# Patient Record
Sex: Male | Born: 1940 | Race: White | Hispanic: No | Marital: Married | Smoking: Former smoker
Health system: Southern US, Community
[De-identification: ages and names within clinical notes are randomized; demographics above are authoritative.]

## PROBLEM LIST (undated history)

## (undated) DIAGNOSIS — M1990 Unspecified osteoarthritis, unspecified site: Secondary | ICD-10-CM

## (undated) DIAGNOSIS — C801 Malignant (primary) neoplasm, unspecified: Secondary | ICD-10-CM

## (undated) DIAGNOSIS — E7800 Pure hypercholesterolemia, unspecified: Secondary | ICD-10-CM

## (undated) DIAGNOSIS — I10 Essential (primary) hypertension: Secondary | ICD-10-CM

## (undated) DIAGNOSIS — E119 Type 2 diabetes mellitus without complications: Secondary | ICD-10-CM

## (undated) DIAGNOSIS — I509 Heart failure, unspecified: Secondary | ICD-10-CM

## (undated) HISTORY — PX: KNEE SURGERY: SHX244

## (undated) HISTORY — PX: BLADDER SURGERY: SHX569

## (undated) HISTORY — PX: BICEPS TENDON REPAIR: SHX566

## (undated) HISTORY — PX: COLONOSCOPY: SHX174

## (undated) HISTORY — PX: TONSILLECTOMY: SUR1361

## (undated) HISTORY — PX: BACK SURGERY: SHX140

---

## 1998-06-17 ENCOUNTER — Emergency Department (HOSPITAL_COMMUNITY): Admission: EM | Admit: 1998-06-17 | Discharge: 1998-06-17 | Payer: Self-pay | Admitting: Emergency Medicine

## 1999-10-24 ENCOUNTER — Ambulatory Visit (HOSPITAL_COMMUNITY): Admission: RE | Admit: 1999-10-24 | Discharge: 1999-10-24 | Payer: Self-pay | Admitting: Gastroenterology

## 2000-03-27 ENCOUNTER — Ambulatory Visit (HOSPITAL_COMMUNITY): Admission: RE | Admit: 2000-03-27 | Discharge: 2000-03-27 | Payer: Self-pay | Admitting: Internal Medicine

## 2000-04-18 ENCOUNTER — Ambulatory Visit (HOSPITAL_COMMUNITY): Admission: RE | Admit: 2000-04-18 | Discharge: 2000-04-18 | Payer: Self-pay | Admitting: Internal Medicine

## 2000-05-27 ENCOUNTER — Ambulatory Visit (HOSPITAL_COMMUNITY): Admission: RE | Admit: 2000-05-27 | Discharge: 2000-05-27 | Payer: Self-pay | Admitting: Specialist

## 2000-05-27 ENCOUNTER — Encounter: Payer: Self-pay | Admitting: Specialist

## 2001-06-12 ENCOUNTER — Encounter: Payer: Self-pay | Admitting: Neurosurgery

## 2001-06-12 ENCOUNTER — Encounter: Admission: RE | Admit: 2001-06-12 | Discharge: 2001-06-12 | Payer: Self-pay | Admitting: Neurosurgery

## 2001-06-26 ENCOUNTER — Encounter: Admission: RE | Admit: 2001-06-26 | Discharge: 2001-06-26 | Payer: Self-pay | Admitting: Neurosurgery

## 2001-06-26 ENCOUNTER — Encounter: Payer: Self-pay | Admitting: Neurosurgery

## 2001-07-10 ENCOUNTER — Encounter: Admission: RE | Admit: 2001-07-10 | Discharge: 2001-07-10 | Payer: Self-pay | Admitting: Neurosurgery

## 2001-07-10 ENCOUNTER — Encounter: Payer: Self-pay | Admitting: Neurosurgery

## 2002-05-22 ENCOUNTER — Encounter: Admission: RE | Admit: 2002-05-22 | Discharge: 2002-05-22 | Payer: Self-pay | Admitting: Neurosurgery

## 2002-05-22 ENCOUNTER — Encounter: Payer: Self-pay | Admitting: Neurosurgery

## 2002-06-05 ENCOUNTER — Encounter: Payer: Self-pay | Admitting: Neurosurgery

## 2002-06-05 ENCOUNTER — Encounter: Admission: RE | Admit: 2002-06-05 | Discharge: 2002-06-05 | Payer: Self-pay | Admitting: Neurosurgery

## 2002-06-19 ENCOUNTER — Encounter: Admission: RE | Admit: 2002-06-19 | Discharge: 2002-06-19 | Payer: Self-pay | Admitting: Neurosurgery

## 2002-06-19 ENCOUNTER — Encounter: Payer: Self-pay | Admitting: Neurosurgery

## 2002-11-27 ENCOUNTER — Ambulatory Visit (HOSPITAL_COMMUNITY): Admission: RE | Admit: 2002-11-27 | Discharge: 2002-11-27 | Payer: Self-pay | Admitting: Gastroenterology

## 2005-10-24 ENCOUNTER — Encounter: Admission: RE | Admit: 2005-10-24 | Discharge: 2005-10-24 | Payer: Self-pay | Admitting: Family Medicine

## 2006-09-11 ENCOUNTER — Emergency Department (HOSPITAL_COMMUNITY): Admission: EM | Admit: 2006-09-11 | Discharge: 2006-09-11 | Payer: Self-pay | Admitting: Emergency Medicine

## 2006-10-04 ENCOUNTER — Inpatient Hospital Stay (HOSPITAL_COMMUNITY): Admission: RE | Admit: 2006-10-04 | Discharge: 2006-10-05 | Payer: Self-pay | Admitting: Neurosurgery

## 2007-01-09 ENCOUNTER — Ambulatory Visit (HOSPITAL_COMMUNITY): Admission: RE | Admit: 2007-01-09 | Discharge: 2007-01-09 | Payer: Self-pay | Admitting: Neurosurgery

## 2007-05-14 ENCOUNTER — Ambulatory Visit: Admission: RE | Admit: 2007-05-14 | Discharge: 2007-05-14 | Payer: Self-pay | Admitting: Specialist

## 2007-05-14 ENCOUNTER — Ambulatory Visit: Payer: Self-pay | Admitting: Vascular Surgery

## 2010-04-19 ENCOUNTER — Encounter: Admission: RE | Admit: 2010-04-19 | Discharge: 2010-04-19 | Payer: Self-pay | Admitting: Neurosurgery

## 2010-07-03 ENCOUNTER — Encounter: Admission: RE | Admit: 2010-07-03 | Discharge: 2010-07-03 | Payer: Self-pay | Admitting: Family Medicine

## 2010-10-29 ENCOUNTER — Encounter: Payer: Self-pay | Admitting: Family Medicine

## 2011-02-23 NOTE — Op Note (Signed)
Douglas Haas, Douglas Haas                ACCOUNT NO.:  [id number]   MEDICAL RECORD NO.:  [id number]          PATIENT TYPE:  INP   LOCATION:  3013                         FACILITY:  [facility]   PHYSICIAN:  Payton Doughty, M.D.      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   POSTOPERATIVE DIAGNOSIS:  Spondylosis on the left side L3-4, L4-5, and  L5-S1.   OPERATIVE PROCEDURES:  L3-4, L4-5, L5-S1 laminotomy, foraminotomy down  on the left.   SURGEON:  Payton Doughty, M.D.   BODY OF TEXT:  A 70 year old gentleman with severe left lower extremity  pain and spondylosis on the left side at L3-4, L4-5 and L5-S1 taken to  operating room and smoothly anesthetized and intubated, placed prone on  the operating table; following shave, prepped and draped in the usual  sterile fashion.  Skin was infiltrated with 1% lidocaine with 1:400,000  epinephrine.  Skin was incised from the top of L3 to the bottom of L5;  and the lamina of L3, L4, and L5 were exposed on the left side in the  subperiosteal plane.  Intraoperative x-ray confirmed the correctness of  this level.   Laminotomy and foraminotomy was carried out on the left side using the  high-speed drill and the Kerrison.  The bone was removed to the top of  the ligamentum flavum; that was removed in a retrograde fashion, and  undercut laterally.  This allowed decompression of the medial part of  the neural foramina as well as the proximal portion of the nerve root.  The most severely affected level was L4-5 where there was significant  compression on both the L4 and L5 root as they traversed this area  following complete decompression, each neural foramen was carefully  inspected and found to be open.   The wound was irrigated; and hemostasis assured.  The laminotomy defects  were filled with  Depo-Medrol soaked fat.  Successive layers of #0  Vicryl, 2-0 Vicryl, and 3-0 nylon were used to close.  Betadine and  Telfa dressing was applied and OpSite.  The patient returned to the  recovery room in good condition.   .           ______________________________  Payton Doughty, M.D.     MWR/MEDQ  D:  10/04/2006  T:  10/04/2006  Job:  [id number]

## 2011-02-23 NOTE — Op Note (Signed)
   NAMECAROLD, EISNER                          ACCOUNT NO.:  [id number]   MEDICAL RECORD NO.:  [id number]                   PATIENT TYPE:  AMB   LOCATION:  ENDO                                 FACILITY:  [facility]   PHYSICIAN:  James L. Malon Kindle., M.D.          DATE OF BIRTH:  Oct 05, 1941   DATE OF PROCEDURE:  11/27/2002  DATE OF DISCHARGE:                                 OPERATIVE REPORT   PROCEDURE:  Colonoscopy.   MEDICATIONS:  Fentanyl 60 mcg, and Versed 6 mg IV.   INSTRUMENT USED:  Olympus pediatric colonoscope.   INDICATIONS FOR PROCEDURE:  The patient with a previous history of  adenomatous polyps.  This is done as a three-year follow-up.   DESCRIPTION OF PROCEDURE:  The procedure had been explained to the patient  and consent obtained. With the patient in the left lateral decubitus  position, the Olympus scope was inserted and advanced under direct  visualization.  The prep was excellent.  We were able to advance to the  cecum without difficulty.  The ileocecal valve and appendiceal orifice seen.  The scope was withdrawn and the cecum, ascending colon, hepatic flexure,  transverse colon, splenic flexure, and descending colon were seen well with  no further polyps seen.  Small lipoma seen in the ascending colon. No polyps  throughout. The rectum was free of polyps as well. The scope was withdrawn  and the patient tolerated the procedure well.   ASSESSMENT:  No evidence of further colon polyps.   PLAN:  Will recommend repeating in five years and recommend yearly  hemoccults.                                               James L. Malon Kindle., M.D.    Waldron Session  D:  11/27/2002  T:  11/27/2002  Job:  [id number]   cc:   L. Lupe Carney, M.D.  [address] [place]  [place] [address]  Fax: [phone]

## 2011-02-23 NOTE — Procedures (Signed)
[place]  Patient:    Douglas Haas                          MRN: [id number] Proc. Date: 10/24/99 Adm. Date:  [id number] Attending:  Orland Mustard CC:         Abran Cantor. Clovis Riley, MD                           Procedure Report  PROCEDURE PERFORMED:  Colonoscopy with polypectomy.  ENDOSCOPIST:  Llana Aliment. Randa Evens, M.D.  MEDICATIONS USED:  Fentanyl 50 mcg, Versed 5 mg IV.  INSTRUMENT:  Olympus adult video colonoscope.  INDICATIONS:  The patient is a 70 year old gentleman with a personal history of  bladder cancer treated laparoscopically and extraordinarily positive family history of colon cancer.  He had an aunt and brother die of colon cancer and another brother had colon cancer but was cured with surgery.  Due to his bladder cancer, strongly positive family history of colon cancer, colonoscopy is requested.  DESCRIPTION OF PROCEDURE:  The procedure had been explained to the patient and consent obtained.  With the patient in the left lateral decubitus position, the  Olympus adult video colonoscope was inserted and advanced under direct visualization.  The prep was excellent and we were able to reach the cecum without difficulty. The ileocecal valve and appendiceal orifice were seen.  The scope was withdrawn.  The cecum, ascending colon, hepatic flexure, transverse colon were een well and were normal.  In the proximal descending colon, a 1 cm pedunculated polyp was encountered and was removed with a snare and sucked through the scope. There was no bleeding at the site.  The remainder of the colon was carefully examined  including the remainder of the descending colon and sigmoid colon and no other polyps or other lesions were seen.  Internal hemorrhoids were seen in the rectum upon removal of the scope.  Scope withdrawn, patient tolerated the procedure well. Maintained on low flow oxygen and pulse oximeter throughout the procedure  with o obvious problem.  ASSESSMENT:  Descending colon polyp removed.  PLAN:  Will check pathology report and plan on repeating his colonoscopy in three years. DD:  10/24/99 TD:  10/24/99 Job: [id number]

## 2011-02-23 NOTE — H&P (Signed)
NAMEADRIANA, LINA NO.:  [id number]   MEDICAL RECORD NO.:  [id number]          PATIENT TYPE:  INP   LOCATION:  3013                         FACILITY:  [facility]   PHYSICIAN:  Payton Doughty, M.D.      DATE OF BIRTH:  [birth year]   DATE OF ADMISSION:  10/04/2006  DATE OF DISCHARGE:                              HISTORY & PHYSICAL   ADMITTING DIAGNOSIS:  Spondylosis of L3-4, L4-5, L5-S1.   HISTORY OF PRESENT ILLNESS:  This is a 70 year old right-handed white  gentleman who has off and on had pain in his back and severe attacks  down his left leg over the past couple of years.  I had been following  him on a 74-month basis, but he has recently had several bouts of pain  down his back, down his left leg.  MR demonstrates severe spondylosis at  L3-4, L4-5, L5-S1, particularly off to the left side.  He is admitted  now for decompressive laminotomy, foraminotomy at those levels.   MEDICAL HISTORY:  Is otherwise benign.   He takes Vytorin, Surveyor, quantity.   He is ALLERGIC to NAPROSYN, PENICILLIN, and DILAUDID.   SURGICAL HISTORY:  He has a knee operation in March of '02.   FAMILY HISTORY:  Mom is 15 and in good health with early diabetes.  Dad  is deceased at 9 with Parkinson's disease.   SOCIAL HISTORY:  Does not smoke, quit 35 years ago.  Does not drink  alcohol and is a Merchandiser, retail at a dock.   REVIEW OF SYSTEMS:  Remarkable for back pain, leg pain, tenderness,  sinus tenderness, wearing glasses.   PHYSICAL EXAMINATION:  HEENT EXAM:  Within normal limits.  He has  reasonable range of motion in the neck.  CHEST:  Clear.  CARDIAC EXAM:  Regular rate and rhythm.  ABDOMEN:  Nontender.  No hepatosplenomegaly.  EXTREMITIES:  Without clubbing or cyanosis.  GU EXAM:  Deferred.  EXTREMITIES:  Peripheral pulses are good.  NEUROLOGICALLY:  He is awake, alert, and oriented.  His cranial nerves  are intact.  Motor exam shows 5/5 strength throughout the upper and  lower  extremities except for the dorsiflexors of the left that are 5-/5.  He has a positive straight leg raise on the left.  The sensory  dysesthesia described in a left L4 and L5 distribution.  Reflexes are  absent at the left knee, one at right, one at the ankles bilaterally.   MR demonstrates severe spondylosis at 3-4, 4-5, and 5-1, worse on the  left.   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   PLAN:  For lumbar laminotomy, foraminotomy at 3-4, 4-5, and 5-1.  The  risks and benefits of this approach have been discussed with him and he  wishes to proceed.           ______________________________  Payton Doughty, M.D.     MWR/MEDQ  D:  10/04/2006  T:  10/04/2006  Job:  [id number]

## 2013-08-04 ENCOUNTER — Other Ambulatory Visit: Payer: Self-pay | Admitting: Gastroenterology

## 2013-08-05 ENCOUNTER — Encounter (HOSPITAL_COMMUNITY): Payer: Self-pay | Admitting: *Deleted

## 2013-08-05 ENCOUNTER — Ambulatory Visit (HOSPITAL_COMMUNITY)
Admission: RE | Admit: 2013-08-05 | Discharge: 2013-08-05 | Disposition: A | Discharge status: Home / Self Care | Payer: Medicare Other | Source: Ambulatory Visit | Attending: Gastroenterology | Admitting: Gastroenterology

## 2013-08-05 ENCOUNTER — Encounter (HOSPITAL_COMMUNITY)
Admission: RE | Disposition: A | Discharge status: Home / Self Care | Payer: Self-pay | Source: Ambulatory Visit | Attending: Gastroenterology

## 2013-08-05 DIAGNOSIS — K6289 Other specified diseases of anus and rectum: Secondary | ICD-10-CM | POA: Insufficient documentation

## 2013-08-05 HISTORY — DX: Malignant (primary) neoplasm, unspecified: C80.1

## 2013-08-05 HISTORY — DX: Unspecified osteoarthritis, unspecified site: M19.90

## 2013-08-05 HISTORY — DX: Essential (primary) hypertension: I10

## 2013-08-05 HISTORY — DX: Type 2 diabetes mellitus without complications: E11.9

## 2013-08-05 HISTORY — DX: Pure hypercholesterolemia, unspecified: E78.00

## 2013-08-05 HISTORY — DX: Heart failure, unspecified: I50.9

## 2013-08-05 HISTORY — PX: EUS: SHX5427

## 2013-08-05 LAB — GLUCOSE, CAPILLARY: Glucose-Capillary: 115 mg/dL — ABNORMAL HIGH (ref 70–99)

## 2013-08-05 SURGERY — ULTRASOUND, LOWER GI TRACT, ENDOSCOPIC
Anesthesia: Moderate Sedation

## 2013-08-05 MED ORDER — MIDAZOLAM HCL 10 MG/2ML IJ SOLN
INTRAMUSCULAR | Status: AC
  Filled 2013-08-05: qty 2

## 2013-08-05 MED ORDER — SODIUM CHLORIDE 0.9 % IV SOLN
INTRAVENOUS | Status: DC
  Administered 2013-08-05: 500 mL via INTRAVENOUS

## 2013-08-05 MED ORDER — FENTANYL CITRATE 0.05 MG/ML IJ SOLN
INTRAMUSCULAR | Status: AC
  Filled 2013-08-05: qty 2

## 2013-08-05 MED ORDER — FENTANYL CITRATE 0.05 MG/ML IJ SOLN
INTRAMUSCULAR | Status: DC | PRN
  Administered 2013-08-05 (×2): 25 ug via INTRAVENOUS

## 2013-08-05 MED ORDER — CIPROFLOXACIN IN D5W 400 MG/200ML IV SOLN
INTRAVENOUS | Status: AC
  Filled 2013-08-05: qty 200

## 2013-08-05 MED ORDER — MIDAZOLAM HCL 10 MG/2ML IJ SOLN
INTRAMUSCULAR | Status: DC | PRN
  Administered 2013-08-05 (×2): 2 mg via INTRAVENOUS

## 2013-08-05 MED ORDER — SODIUM CHLORIDE 0.9 % IV SOLN: INTRAVENOUS | Status: DC

## 2013-08-05 NOTE — Addendum Note (Signed)
Addended by: Willis Modena on: 08/05/2013 07:57 AM   Modules accepted: Orders

## 2013-08-05 NOTE — Op Note (Signed)
[facility] [address] [place], [address]   OPERATIVE PROCEDURE REPORT  PATIENT: Douglas Haas, Douglas Haas  MR#: [id number] BIRTHDATE: [birth year]  GENDER: Male ENDOSCOPIST: Willis Modena, MD REFERRED BY:  Carman Ching, M.D. PROCEDURE DATE:  08/05/2013 PROCEDURE:   Flexible sigmoidoscopy EUS ASA CLASS:   Class II INDICATIONS:1.  rectal nodule. MEDICATIONS: Fentanyl 50 mcg IV and Versed 4 mg IV  DESCRIPTION OF PROCEDURE:   After the risks benefits and alternatives of the procedure were thoroughly explained, informed consent was obtained.  Throughout the procedure, the patients blood pressure, pulse and oxygen saturations were monitored continuously. Under direct visualization, the forward-viewing radial echoendoscope was introduced through the anus  and advanced to the rectum .  Water was used as necessary to provide an acoustic interface.  Imaging was obtained at 7.5 and . Upon completion of the imaging, water was removed and the patient was sent to the recovery room in satisfactory condition.    FINDINGS:  Digital rectal exam normal.  Echoendoscopic subsequently inserted into the rectum, and water was instilled to facilitate acoustic coupling.  The submucosal nodule was seen in the distal rectum, a few cm proximal to the anal verge.  The lesion is soft to gentle probing.  The lesion is submucosal, with normal overlying mucosa; there is no involvement of the muscularis propria.  The lesion measures about 14 mm x 6 mm in size, is very well-defined and has "salt and pepper" echotexture.  ENDOSCOPIC IMPRESSION: Submucosal distal rectal nodule.  Findings very consistent with lipoma.  No involvement of MP, thus no concern for GIST or leiomyoma.  No features suggestive of carcinoid tumor were identified.  RECOMMENDATIONS: 1.  Watch for potential complications of procedure. 2.  Would not pursue any further routine surveillance of this area; can reinspect in 5  years at time of his next surveillance colonoscopy. 3.  Follow-up with Dr. Randa Evens.   _______________________________ Rosalie DoctorWillis Modena, MD 08/05/2013 11:39 AM   CC:

## 2013-08-05 NOTE — H&P (Signed)
Patient interval history reviewed.  Patient examined again.  There has been no change from documented H/P dated 07/15/13 (scanned into chart from our office) except as documented above.  Assessment:  1.  Rectal nodule.  Plan:  1.  Endorectal ultrasound with possible biopsies (fine needle aspiration, FNA). 2.  Risks (bleeding, infection, bowel perforation that could require surgery, sedation-related changes in cardiopulmonary systems), benefits (identification and possible treatment of source of symptoms, exclusion of certain causes of symptoms), and alternatives (watchful waiting, radiographic imaging studies, empiric medical treatment) of endorectal ultrasound with possible biopsies (RUS +/- FNA) were explained to patient in detail and patient wishes to proceed.

## 2013-08-06 ENCOUNTER — Encounter (HOSPITAL_COMMUNITY): Payer: Self-pay | Admitting: Gastroenterology

## 2013-12-12 DIAGNOSIS — E7800 Pure hypercholesterolemia, unspecified: Secondary | ICD-10-CM

## 2013-12-12 DIAGNOSIS — N529 Male erectile dysfunction, unspecified: Secondary | ICD-10-CM

## 2013-12-12 DIAGNOSIS — I519 Heart disease, unspecified: Secondary | ICD-10-CM

## 2013-12-12 DIAGNOSIS — I509 Heart failure, unspecified: Principal | ICD-10-CM

## 2013-12-12 DIAGNOSIS — E669 Obesity, unspecified: Secondary | ICD-10-CM

## 2013-12-12 DIAGNOSIS — E119 Type 2 diabetes mellitus without complications: Secondary | ICD-10-CM

## 2013-12-12 DIAGNOSIS — Z8551 Personal history of malignant neoplasm of bladder: Secondary | ICD-10-CM

## 2013-12-12 DIAGNOSIS — I110 Hypertensive heart disease with heart failure: Secondary | ICD-10-CM

## 2014-08-11 ENCOUNTER — Other Ambulatory Visit (INDEPENDENT_AMBULATORY_CARE_PROVIDER_SITE_OTHER): Payer: Self-pay | Admitting: Otolaryngology

## 2014-08-11 DIAGNOSIS — H905 Unspecified sensorineural hearing loss: Secondary | ICD-10-CM

## 2014-08-11 DIAGNOSIS — H9041 Sensorineural hearing loss, unilateral, right ear, with unrestricted hearing on the contralateral side: Secondary | ICD-10-CM

## 2014-08-19 ENCOUNTER — Ambulatory Visit
Admission: RE | Admit: 2014-08-19 | Discharge: 2014-08-19 | Disposition: A | Discharge status: Home / Self Care | Payer: Commercial Managed Care - HMO | Source: Ambulatory Visit | Attending: Otolaryngology | Admitting: Otolaryngology

## 2014-08-19 DIAGNOSIS — H9041 Sensorineural hearing loss, unilateral, right ear, with unrestricted hearing on the contralateral side: Secondary | ICD-10-CM

## 2014-08-19 DIAGNOSIS — H905 Unspecified sensorineural hearing loss: Secondary | ICD-10-CM

## 2014-08-19 MED ORDER — GADOBENATE DIMEGLUMINE 529 MG/ML IV SOLN
20.0000 mL | Freq: Once | INTRAVENOUS | Status: AC | PRN
  Administered 2014-08-19: 20 mL via INTRAVENOUS

## 2016-08-31 IMAGING — MR MR HEAD WO/W CM
10 of 11 series · 31 of 48 positions shown · IV contrast (20ml multihance)
Comparison: CT 10/12/2009

CLINICAL DATA: Right-sided hearing loss and pain, 1 month duration.

BUN and creatinine were obtained on site at [HOSPITAL] at
[HOSPITAL].
Results:  BUN 11 mg/dL,  Creatinine 1.0 mg/dL.
EXAM:
MRI HEAD WITHOUT AND WITH CONTRAST
TECHNIQUE: Multiplanar, multiecho pulse sequences of the brain and surrounding
structures were obtained without and with intravenous contrast.
CONTRAST:  20mL MULTIHANCE GADOBENATE DIMEGLUMINE 529 MG/ML IV SOLN

[Series 2: T1 · sagittal · 5.0mm · 0.45mm/px · 2 of 21 slices shown (1 of 3)]
[im 1/21]
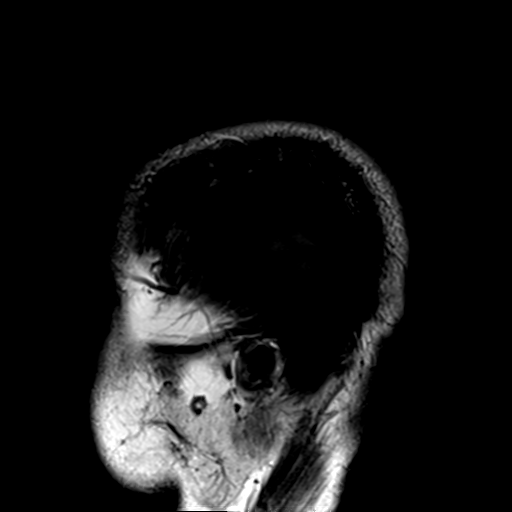
[im 21/21]
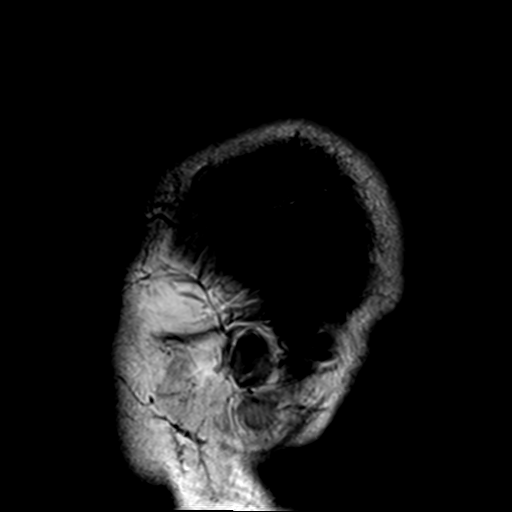

[Series 3: DWI · axial · 5.0mm · 1.80mm/px · z∈[-34,+114]mm · 8 of 48 slices shown (1 of 2)]
[im 1/48]
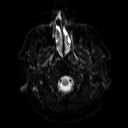
[im 7/48]
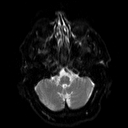
[im 14/48]
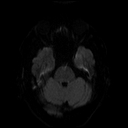
[im 21/48]
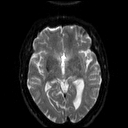
[im 27/48]
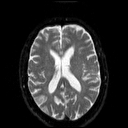
[im 34/48]
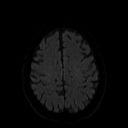
[im 41/48]
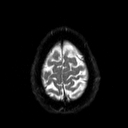
[im 48/48]
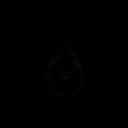

[Series 4: DWI · axial · 5.0mm · 1.80mm/px · z∈[-34,+114]mm · 4 of 24 slices shown (2 of 2)]
[im 1/24]
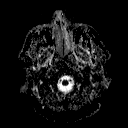
[im 8/24]
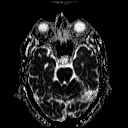
[im 16/24]
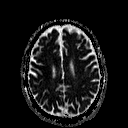
[im 24/24]
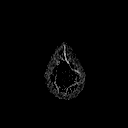

[Series 5: T2 · axial · 5.0mm · 0.45mm/px · z∈[-32,+116]mm · 4 of 24 slices shown]
[im 1/24]
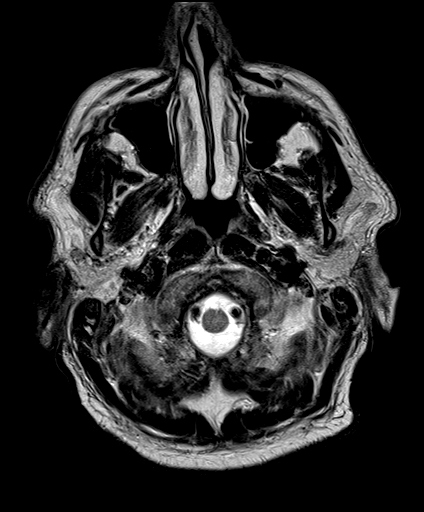
[im 8/24]
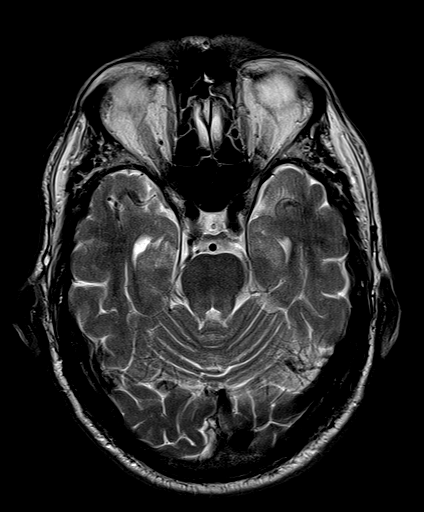
[im 16/24]
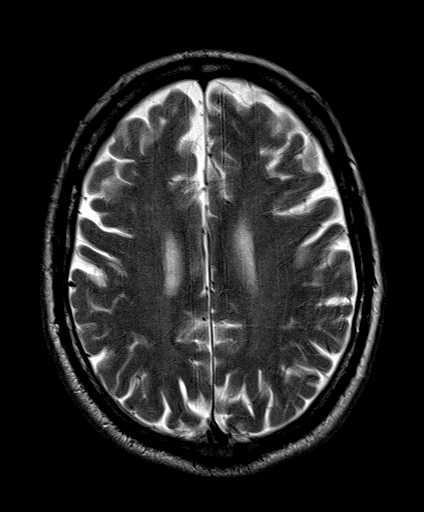
[im 24/24]
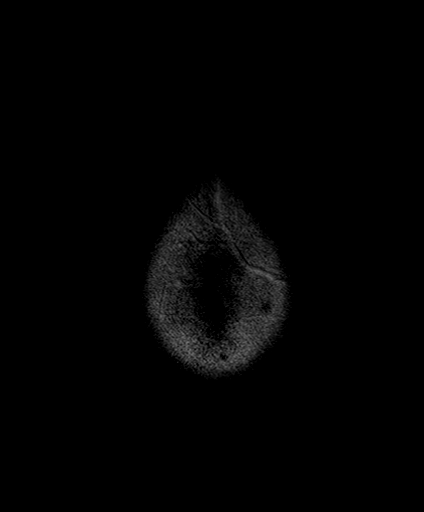

[Series 6: FLAIR · axial · 5.0mm · 0.45mm/px · z∈[-32,+116]mm · 4 of 24 slices shown]
[im 1/24]
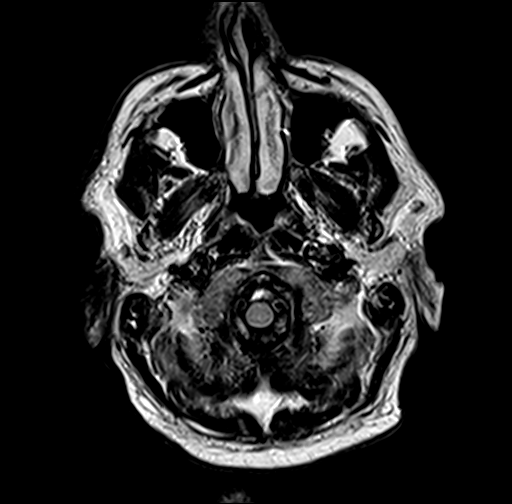
[im 8/24]
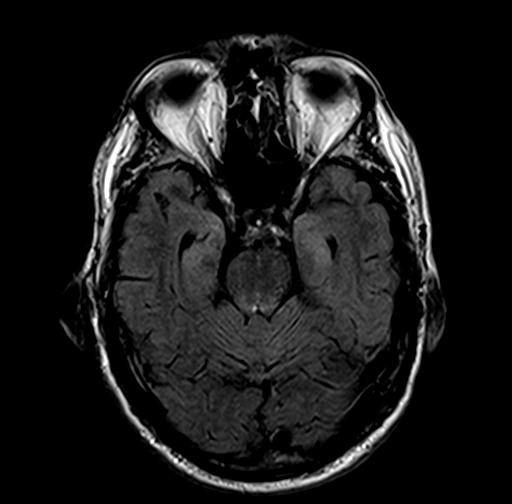
[im 16/24]
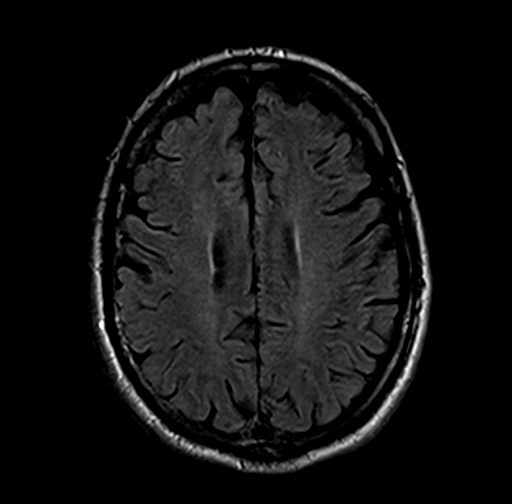
[im 24/24]
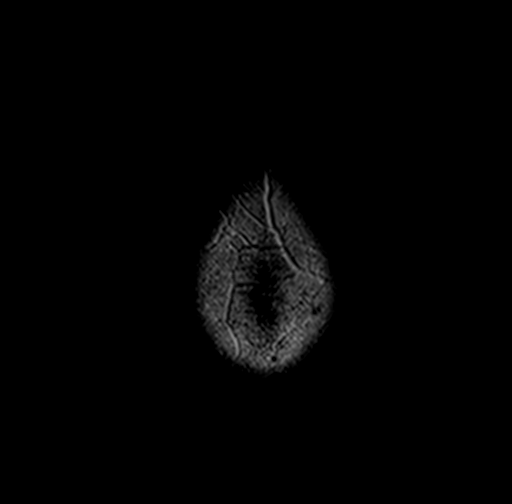

[Series 7: T1 · coronal · 3.0mm · 0.35mm/px · 2 of 12 slices shown (2 of 3)]
[im 1/12]
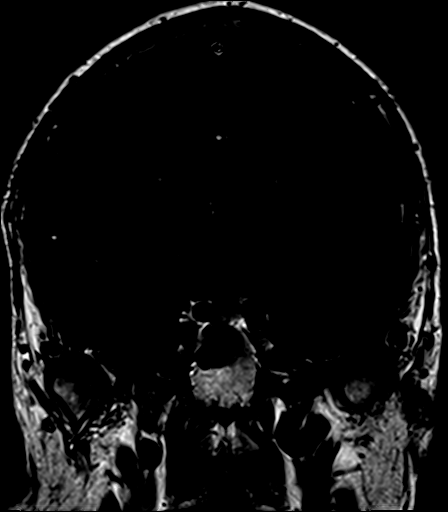
[im 12/12]
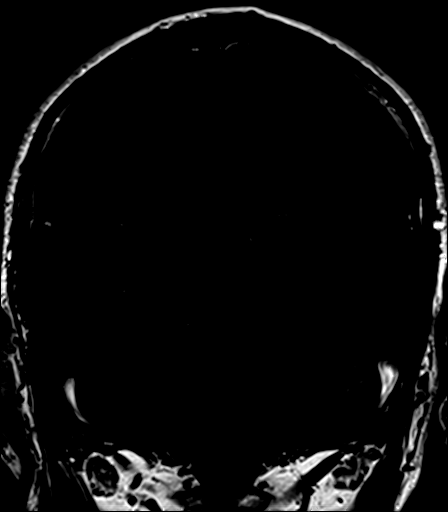

[Series 8: T1 · axial · 3.0mm · 0.37mm/px · z∈[-37,-4]mm · 2 of 11 slices shown (3 of 3)]
[im 1/11]
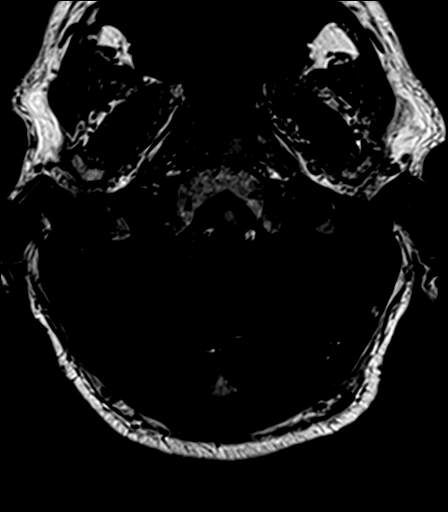
[im 11/11]
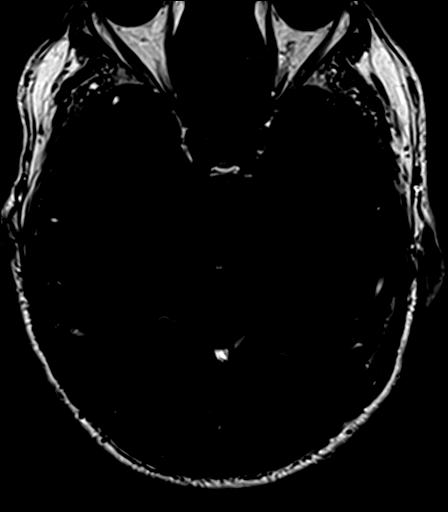

[Series 9: bSSFP · axial · 1.0mm · 0.30mm/px · 1 of 36 slices shown]
[im 1/36]
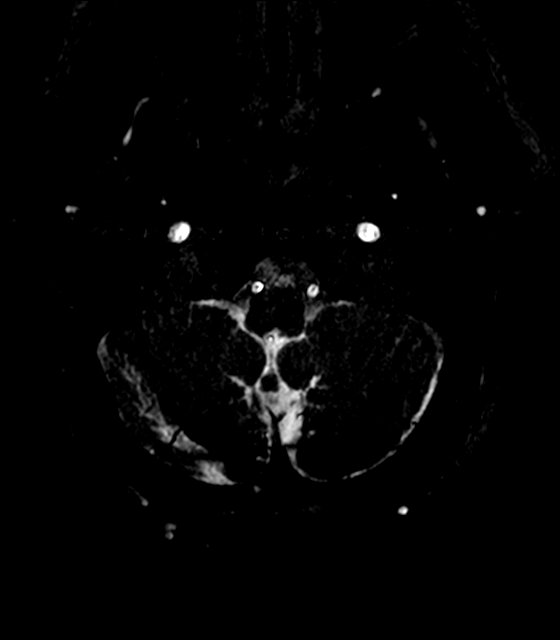

[Series 10: T1 post-contrast · coronal · 3.0mm · 0.35mm/px · 2 of 12 slices shown (1 of 2)]
[im 1/12]
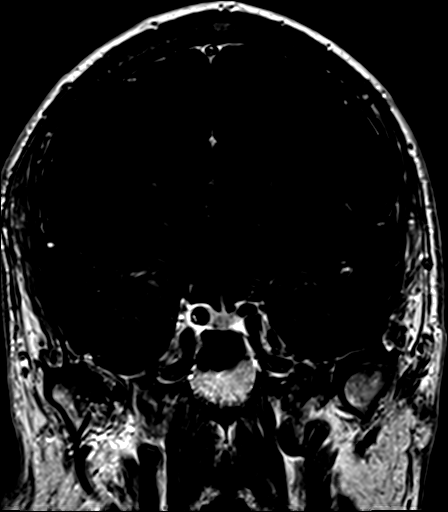
[im 12/12]
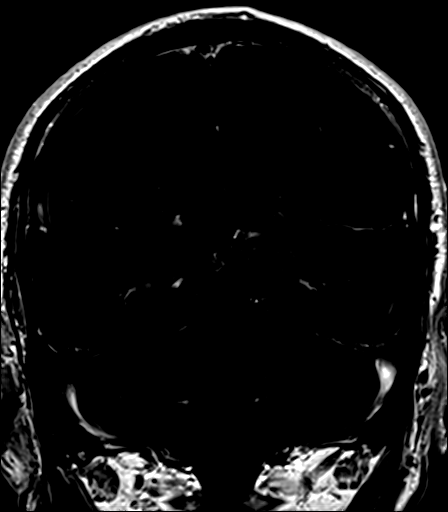

[Series 11: T1 post-contrast · axial · 3.0mm · 0.37mm/px · z∈[-37,-4]mm · 2 of 11 slices shown (2 of 2)]
[im 1/11]
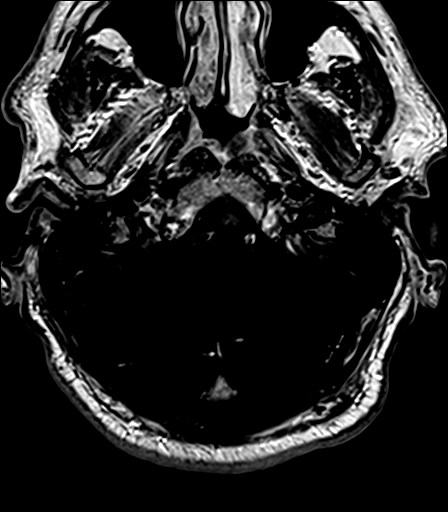
[im 11/11]
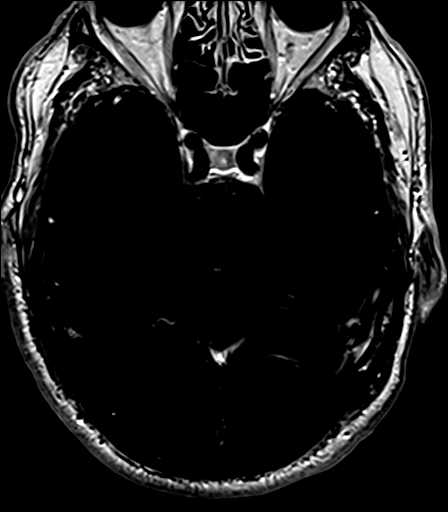

[31 of 48 positions shown; findings below may reference images not displayed]

FINDINGS: Diffusion imaging does not show any acute or subacute infarction.
The brain shows mild generalized atrophy, not advanced for age.
There is no old or acute infarction affecting the brainstem,
cerebellum or cerebral hemispheres. No evidence of intra-axial mass
lesion, hemorrhage, hydrocephalus or extra-axial collection. No
pituitary mass. No fluid in the sinuses. There is a small amount of
fluid in the mastoid air cells, right more than left, not definitely
significant.

CP angle regions appear normal. No vestibular schwannoma or other
lesion. Seventh and eighth nerve complexes appear normal.
IMPRESSION: No cause of right-sided hearing loss identified. No vestibular
schwannoma. No intracranial abnormality.

Small amount a mastoid air cell fluid bilaterally, possibly
subclinical.

## 2021-01-05 DIAGNOSIS — Z0000 Encounter for general adult medical examination without abnormal findings: Secondary | ICD-10-CM | POA: Diagnosis not present

## 2021-01-05 DIAGNOSIS — M1990 Unspecified osteoarthritis, unspecified site: Secondary | ICD-10-CM | POA: Diagnosis not present

## 2021-01-05 DIAGNOSIS — Z8551 Personal history of malignant neoplasm of bladder: Secondary | ICD-10-CM | POA: Diagnosis not present

## 2021-01-05 DIAGNOSIS — E1169 Type 2 diabetes mellitus with other specified complication: Secondary | ICD-10-CM | POA: Diagnosis not present

## 2021-01-05 DIAGNOSIS — R413 Other amnesia: Secondary | ICD-10-CM | POA: Diagnosis not present

## 2021-01-05 DIAGNOSIS — I519 Heart disease, unspecified: Secondary | ICD-10-CM | POA: Diagnosis not present

## 2021-01-05 DIAGNOSIS — E7800 Pure hypercholesterolemia, unspecified: Secondary | ICD-10-CM | POA: Diagnosis not present

## 2021-01-05 DIAGNOSIS — I110 Hypertensive heart disease with heart failure: Secondary | ICD-10-CM | POA: Diagnosis not present

## 2021-01-05 DIAGNOSIS — L853 Xerosis cutis: Secondary | ICD-10-CM | POA: Diagnosis not present

## 2021-06-09 DIAGNOSIS — D2272 Melanocytic nevi of left lower limb, including hip: Secondary | ICD-10-CM | POA: Diagnosis not present

## 2021-06-09 DIAGNOSIS — D225 Melanocytic nevi of trunk: Secondary | ICD-10-CM | POA: Diagnosis not present

## 2021-06-09 DIAGNOSIS — L738 Other specified follicular disorders: Secondary | ICD-10-CM | POA: Diagnosis not present

## 2021-06-09 DIAGNOSIS — L821 Other seborrheic keratosis: Secondary | ICD-10-CM | POA: Diagnosis not present

## 2021-06-27 DIAGNOSIS — R413 Other amnesia: Secondary | ICD-10-CM | POA: Diagnosis not present

## 2021-06-27 DIAGNOSIS — Z23 Encounter for immunization: Secondary | ICD-10-CM | POA: Diagnosis not present

## 2021-06-27 DIAGNOSIS — E7800 Pure hypercholesterolemia, unspecified: Secondary | ICD-10-CM | POA: Diagnosis not present

## 2021-06-27 DIAGNOSIS — I110 Hypertensive heart disease with heart failure: Secondary | ICD-10-CM | POA: Diagnosis not present

## 2021-06-27 DIAGNOSIS — E1169 Type 2 diabetes mellitus with other specified complication: Secondary | ICD-10-CM | POA: Diagnosis not present

## 2021-10-12 DIAGNOSIS — E119 Type 2 diabetes mellitus without complications: Secondary | ICD-10-CM | POA: Diagnosis not present

## 2021-11-30 DIAGNOSIS — D3131 Benign neoplasm of right choroid: Secondary | ICD-10-CM | POA: Diagnosis not present

## 2021-11-30 DIAGNOSIS — H2513 Age-related nuclear cataract, bilateral: Secondary | ICD-10-CM | POA: Diagnosis not present

## 2022-01-29 DIAGNOSIS — M1990 Unspecified osteoarthritis, unspecified site: Secondary | ICD-10-CM | POA: Diagnosis not present

## 2022-01-29 DIAGNOSIS — Z0000 Encounter for general adult medical examination without abnormal findings: Secondary | ICD-10-CM | POA: Diagnosis not present

## 2022-01-29 DIAGNOSIS — G720 Drug-induced myopathy: Secondary | ICD-10-CM | POA: Diagnosis not present

## 2022-01-29 DIAGNOSIS — I519 Heart disease, unspecified: Secondary | ICD-10-CM | POA: Diagnosis not present

## 2022-01-29 DIAGNOSIS — L84 Corns and callosities: Secondary | ICD-10-CM | POA: Diagnosis not present

## 2022-01-29 DIAGNOSIS — E7800 Pure hypercholesterolemia, unspecified: Secondary | ICD-10-CM | POA: Diagnosis not present

## 2022-01-29 DIAGNOSIS — R413 Other amnesia: Secondary | ICD-10-CM | POA: Diagnosis not present

## 2022-01-29 DIAGNOSIS — E1169 Type 2 diabetes mellitus with other specified complication: Secondary | ICD-10-CM | POA: Diagnosis not present

## 2022-01-29 DIAGNOSIS — I110 Hypertensive heart disease with heart failure: Secondary | ICD-10-CM | POA: Diagnosis not present

## 2022-02-08 ENCOUNTER — Ambulatory Visit: Payer: Medicare Other | Admitting: Podiatry

## 2022-02-08 ENCOUNTER — Encounter: Payer: Self-pay | Admitting: Podiatry

## 2022-02-08 DIAGNOSIS — M2141 Flat foot [pes planus] (acquired), right foot: Secondary | ICD-10-CM | POA: Diagnosis not present

## 2022-02-08 DIAGNOSIS — E119 Type 2 diabetes mellitus without complications: Secondary | ICD-10-CM | POA: Diagnosis not present

## 2022-02-08 DIAGNOSIS — L84 Corns and callosities: Secondary | ICD-10-CM

## 2022-02-08 DIAGNOSIS — M7742 Metatarsalgia, left foot: Secondary | ICD-10-CM

## 2022-02-08 DIAGNOSIS — M2142 Flat foot [pes planus] (acquired), left foot: Secondary | ICD-10-CM | POA: Diagnosis not present

## 2022-02-08 NOTE — Patient Instructions (Signed)
Look for urea 40% cream or ointment and apply to the thickened dry skin / calluses. This can be bought over the counter, at a pharmacy or online such as [employer]. ? ? ?Foam or felt callus pads (aperture pads or horseshoe pads) can be bought on [employer] or in the foot care section of a pharmacy ?

## 2022-02-13 ENCOUNTER — Encounter: Payer: Self-pay | Admitting: Podiatry

## 2022-02-13 NOTE — Progress Notes (Signed)
?  Subjective:  ?Patient ID: Douglas Haas, male    DOB: [birth year],  MRN: [id number] ? ?Chief Complaint  ?Patient presents with  ? Callouses  ?  new pt-left plantar callus*diabetic*-f/u in [place]-dr. Mayra Neer refer.   ? ? ?81 y.o. male presents with the above complaint. History confirmed with patient.  He has painful lesion on the bottom of the left foot, takes metformin for his diabetes as his blood sugar has been good and his A1c is well controlled ? ?Objective:  ?Physical Exam: ?warm, good capillary refill, no trophic changes or ulcerative lesions, normal DP and PT pulses, and normal sensory exam.  Pes planus deformity ?Left Foot:  Painful preulcerative callus submetatarsal 2 tender to palpation ? ?Assessment:  ? ?1. Metatarsalgia, left foot   ?2. Type 2 diabetes mellitus without complication, without long-term current use of insulin ([place])   ?3. Pre-ulcerative calluses   ? ? ? ?Plan:  ?Patient was evaluated and treated and all questions answered. ? ?Patient educated on diabetes. Discussed proper diabetic foot care and discussed risks and complications of disease. Educated patient in depth on reasons to return to the office immediately should he/she discover anything concerning or new on the feet. All questions answered. Discussed proper shoes as well.  ? ?All symptomatic hyperkeratoses were safely debrided with a sterile #15 blade to patient's level of comfort without incident. We discussed preventative and palliative care of these lesions including supportive and accommodative shoegear, padding, prefabricated and custom molded accommodative orthoses, use of a pumice stone and lotions/creams daily. ? ?I recommended extra-depth diabeticShoes and multidensity insoles to offload the preulcerative callus and lesion I think he likely would do well with this.  They will meet with the pedorthist for fitting for these. ? ?Return if symptoms worsen or fail to improve.  ? ?

## 2022-02-20 DIAGNOSIS — N179 Acute kidney failure, unspecified: Secondary | ICD-10-CM | POA: Diagnosis not present

## 2022-03-09 DIAGNOSIS — I110 Hypertensive heart disease with heart failure: Secondary | ICD-10-CM | POA: Diagnosis not present

## 2022-03-09 DIAGNOSIS — N179 Acute kidney failure, unspecified: Secondary | ICD-10-CM | POA: Diagnosis not present

## 2022-03-14 ENCOUNTER — Other Ambulatory Visit: Payer: Self-pay | Admitting: Family Medicine

## 2022-03-14 DIAGNOSIS — N179 Acute kidney failure, unspecified: Secondary | ICD-10-CM

## 2022-03-23 ENCOUNTER — Ambulatory Visit
Admission: RE | Admit: 2022-03-23 | Discharge: 2022-03-23 | Disposition: A | Discharge status: Home / Self Care | Payer: Medicare Other | Source: Ambulatory Visit | Attending: Family Medicine | Admitting: Family Medicine

## 2022-03-23 DIAGNOSIS — N179 Acute kidney failure, unspecified: Secondary | ICD-10-CM

## 2022-04-12 DIAGNOSIS — N179 Acute kidney failure, unspecified: Secondary | ICD-10-CM | POA: Diagnosis not present

## 2022-05-31 DIAGNOSIS — H2513 Age-related nuclear cataract, bilateral: Secondary | ICD-10-CM | POA: Diagnosis not present

## 2022-05-31 DIAGNOSIS — D3131 Benign neoplasm of right choroid: Secondary | ICD-10-CM | POA: Diagnosis not present

## 2022-06-21 DIAGNOSIS — N189 Chronic kidney disease, unspecified: Secondary | ICD-10-CM | POA: Diagnosis not present

## 2022-06-21 DIAGNOSIS — N1832 Chronic kidney disease, stage 3b: Secondary | ICD-10-CM | POA: Diagnosis not present

## 2022-06-21 DIAGNOSIS — I129 Hypertensive chronic kidney disease with stage 1 through stage 4 chronic kidney disease, or unspecified chronic kidney disease: Secondary | ICD-10-CM | POA: Diagnosis not present

## 2022-06-21 DIAGNOSIS — D631 Anemia in chronic kidney disease: Secondary | ICD-10-CM | POA: Diagnosis not present

## 2022-06-21 DIAGNOSIS — N2581 Secondary hyperparathyroidism of renal origin: Secondary | ICD-10-CM | POA: Diagnosis not present

## 2022-06-21 DIAGNOSIS — E1122 Type 2 diabetes mellitus with diabetic chronic kidney disease: Secondary | ICD-10-CM | POA: Diagnosis not present

## 2022-07-17 DIAGNOSIS — D225 Melanocytic nevi of trunk: Secondary | ICD-10-CM | POA: Diagnosis not present

## 2022-07-17 DIAGNOSIS — D224 Melanocytic nevi of scalp and neck: Secondary | ICD-10-CM | POA: Diagnosis not present

## 2022-07-17 DIAGNOSIS — L853 Xerosis cutis: Secondary | ICD-10-CM | POA: Diagnosis not present

## 2022-07-17 DIAGNOSIS — L821 Other seborrheic keratosis: Secondary | ICD-10-CM | POA: Diagnosis not present

## 2022-07-17 DIAGNOSIS — D2272 Melanocytic nevi of left lower limb, including hip: Secondary | ICD-10-CM | POA: Diagnosis not present

## 2022-07-23 DIAGNOSIS — N1831 Chronic kidney disease, stage 3a: Secondary | ICD-10-CM | POA: Diagnosis not present

## 2022-07-23 DIAGNOSIS — D631 Anemia in chronic kidney disease: Secondary | ICD-10-CM | POA: Diagnosis not present

## 2022-07-23 DIAGNOSIS — I129 Hypertensive chronic kidney disease with stage 1 through stage 4 chronic kidney disease, or unspecified chronic kidney disease: Secondary | ICD-10-CM | POA: Diagnosis not present

## 2022-07-23 DIAGNOSIS — E1122 Type 2 diabetes mellitus with diabetic chronic kidney disease: Secondary | ICD-10-CM | POA: Diagnosis not present

## 2022-07-23 DIAGNOSIS — N2581 Secondary hyperparathyroidism of renal origin: Secondary | ICD-10-CM | POA: Diagnosis not present

## 2022-08-01 DIAGNOSIS — N183 Chronic kidney disease, stage 3 unspecified: Secondary | ICD-10-CM | POA: Diagnosis not present

## 2022-08-01 DIAGNOSIS — I519 Heart disease, unspecified: Secondary | ICD-10-CM | POA: Diagnosis not present

## 2022-08-01 DIAGNOSIS — E1169 Type 2 diabetes mellitus with other specified complication: Secondary | ICD-10-CM | POA: Diagnosis not present

## 2022-08-01 DIAGNOSIS — E7800 Pure hypercholesterolemia, unspecified: Secondary | ICD-10-CM | POA: Diagnosis not present

## 2022-08-01 DIAGNOSIS — Z23 Encounter for immunization: Secondary | ICD-10-CM | POA: Diagnosis not present

## 2022-08-01 DIAGNOSIS — I130 Hypertensive heart and chronic kidney disease with heart failure and stage 1 through stage 4 chronic kidney disease, or unspecified chronic kidney disease: Secondary | ICD-10-CM | POA: Diagnosis not present

## 2022-09-13 DIAGNOSIS — M461 Sacroiliitis, not elsewhere classified: Secondary | ICD-10-CM | POA: Diagnosis not present

## 2022-09-13 DIAGNOSIS — M7918 Myalgia, other site: Secondary | ICD-10-CM | POA: Diagnosis not present

## 2022-09-13 DIAGNOSIS — M9902 Segmental and somatic dysfunction of thoracic region: Secondary | ICD-10-CM | POA: Diagnosis not present

## 2022-09-13 DIAGNOSIS — M47816 Spondylosis without myelopathy or radiculopathy, lumbar region: Secondary | ICD-10-CM | POA: Diagnosis not present

## 2022-09-13 DIAGNOSIS — M9905 Segmental and somatic dysfunction of pelvic region: Secondary | ICD-10-CM | POA: Diagnosis not present

## 2022-09-13 DIAGNOSIS — M5387 Other specified dorsopathies, lumbosacral region: Secondary | ICD-10-CM | POA: Diagnosis not present

## 2022-09-13 DIAGNOSIS — M9903 Segmental and somatic dysfunction of lumbar region: Secondary | ICD-10-CM | POA: Diagnosis not present

## 2022-09-18 DIAGNOSIS — M461 Sacroiliitis, not elsewhere classified: Secondary | ICD-10-CM | POA: Diagnosis not present

## 2022-09-18 DIAGNOSIS — M47816 Spondylosis without myelopathy or radiculopathy, lumbar region: Secondary | ICD-10-CM | POA: Diagnosis not present

## 2022-09-18 DIAGNOSIS — M9905 Segmental and somatic dysfunction of pelvic region: Secondary | ICD-10-CM | POA: Diagnosis not present

## 2022-09-18 DIAGNOSIS — M5387 Other specified dorsopathies, lumbosacral region: Secondary | ICD-10-CM | POA: Diagnosis not present

## 2022-09-18 DIAGNOSIS — M7918 Myalgia, other site: Secondary | ICD-10-CM | POA: Diagnosis not present

## 2022-09-18 DIAGNOSIS — M9902 Segmental and somatic dysfunction of thoracic region: Secondary | ICD-10-CM | POA: Diagnosis not present

## 2022-09-18 DIAGNOSIS — M9903 Segmental and somatic dysfunction of lumbar region: Secondary | ICD-10-CM | POA: Diagnosis not present

## 2022-09-25 DIAGNOSIS — M9903 Segmental and somatic dysfunction of lumbar region: Secondary | ICD-10-CM | POA: Diagnosis not present

## 2022-09-25 DIAGNOSIS — M5387 Other specified dorsopathies, lumbosacral region: Secondary | ICD-10-CM | POA: Diagnosis not present

## 2022-09-25 DIAGNOSIS — M7918 Myalgia, other site: Secondary | ICD-10-CM | POA: Diagnosis not present

## 2022-09-25 DIAGNOSIS — M9902 Segmental and somatic dysfunction of thoracic region: Secondary | ICD-10-CM | POA: Diagnosis not present

## 2022-09-25 DIAGNOSIS — M461 Sacroiliitis, not elsewhere classified: Secondary | ICD-10-CM | POA: Diagnosis not present

## 2022-09-25 DIAGNOSIS — M9905 Segmental and somatic dysfunction of pelvic region: Secondary | ICD-10-CM | POA: Diagnosis not present

## 2022-09-25 DIAGNOSIS — M47816 Spondylosis without myelopathy or radiculopathy, lumbar region: Secondary | ICD-10-CM | POA: Diagnosis not present

## 2022-10-09 DIAGNOSIS — R059 Cough, unspecified: Secondary | ICD-10-CM | POA: Diagnosis not present

## 2022-10-09 DIAGNOSIS — J069 Acute upper respiratory infection, unspecified: Secondary | ICD-10-CM | POA: Diagnosis not present

## 2022-10-09 DIAGNOSIS — E1169 Type 2 diabetes mellitus with other specified complication: Secondary | ICD-10-CM | POA: Diagnosis not present

## 2022-10-09 DIAGNOSIS — I519 Heart disease, unspecified: Secondary | ICD-10-CM | POA: Diagnosis not present

## 2022-10-09 DIAGNOSIS — N183 Chronic kidney disease, stage 3 unspecified: Secondary | ICD-10-CM | POA: Diagnosis not present

## 2022-10-09 DIAGNOSIS — I130 Hypertensive heart and chronic kidney disease with heart failure and stage 1 through stage 4 chronic kidney disease, or unspecified chronic kidney disease: Secondary | ICD-10-CM | POA: Diagnosis not present

## 2022-10-16 DIAGNOSIS — M47816 Spondylosis without myelopathy or radiculopathy, lumbar region: Secondary | ICD-10-CM | POA: Diagnosis not present

## 2022-10-16 DIAGNOSIS — M9903 Segmental and somatic dysfunction of lumbar region: Secondary | ICD-10-CM | POA: Diagnosis not present

## 2022-10-16 DIAGNOSIS — M9905 Segmental and somatic dysfunction of pelvic region: Secondary | ICD-10-CM | POA: Diagnosis not present

## 2022-10-16 DIAGNOSIS — M5387 Other specified dorsopathies, lumbosacral region: Secondary | ICD-10-CM | POA: Diagnosis not present

## 2022-10-16 DIAGNOSIS — M9902 Segmental and somatic dysfunction of thoracic region: Secondary | ICD-10-CM | POA: Diagnosis not present

## 2022-10-16 DIAGNOSIS — M461 Sacroiliitis, not elsewhere classified: Secondary | ICD-10-CM | POA: Diagnosis not present

## 2022-10-16 DIAGNOSIS — M7918 Myalgia, other site: Secondary | ICD-10-CM | POA: Diagnosis not present

## 2022-11-13 DIAGNOSIS — M9903 Segmental and somatic dysfunction of lumbar region: Secondary | ICD-10-CM | POA: Diagnosis not present

## 2022-11-13 DIAGNOSIS — M461 Sacroiliitis, not elsewhere classified: Secondary | ICD-10-CM | POA: Diagnosis not present

## 2022-11-13 DIAGNOSIS — M9905 Segmental and somatic dysfunction of pelvic region: Secondary | ICD-10-CM | POA: Diagnosis not present

## 2022-11-13 DIAGNOSIS — M7918 Myalgia, other site: Secondary | ICD-10-CM | POA: Diagnosis not present

## 2022-11-13 DIAGNOSIS — M9902 Segmental and somatic dysfunction of thoracic region: Secondary | ICD-10-CM | POA: Diagnosis not present

## 2022-11-13 DIAGNOSIS — M47816 Spondylosis without myelopathy or radiculopathy, lumbar region: Secondary | ICD-10-CM | POA: Diagnosis not present

## 2022-11-13 DIAGNOSIS — M5387 Other specified dorsopathies, lumbosacral region: Secondary | ICD-10-CM | POA: Diagnosis not present

## 2022-12-04 DIAGNOSIS — M5387 Other specified dorsopathies, lumbosacral region: Secondary | ICD-10-CM | POA: Diagnosis not present

## 2022-12-04 DIAGNOSIS — M461 Sacroiliitis, not elsewhere classified: Secondary | ICD-10-CM | POA: Diagnosis not present

## 2022-12-04 DIAGNOSIS — M9903 Segmental and somatic dysfunction of lumbar region: Secondary | ICD-10-CM | POA: Diagnosis not present

## 2022-12-04 DIAGNOSIS — M7918 Myalgia, other site: Secondary | ICD-10-CM | POA: Diagnosis not present

## 2022-12-04 DIAGNOSIS — M9905 Segmental and somatic dysfunction of pelvic region: Secondary | ICD-10-CM | POA: Diagnosis not present

## 2022-12-04 DIAGNOSIS — M47816 Spondylosis without myelopathy or radiculopathy, lumbar region: Secondary | ICD-10-CM | POA: Diagnosis not present

## 2022-12-04 DIAGNOSIS — M9902 Segmental and somatic dysfunction of thoracic region: Secondary | ICD-10-CM | POA: Diagnosis not present

## 2022-12-13 DIAGNOSIS — E119 Type 2 diabetes mellitus without complications: Secondary | ICD-10-CM | POA: Diagnosis not present

## 2023-01-21 DIAGNOSIS — N1831 Chronic kidney disease, stage 3a: Secondary | ICD-10-CM | POA: Diagnosis not present

## 2023-01-21 DIAGNOSIS — H903 Sensorineural hearing loss, bilateral: Secondary | ICD-10-CM | POA: Diagnosis not present

## 2023-01-31 DIAGNOSIS — N1831 Chronic kidney disease, stage 3a: Secondary | ICD-10-CM | POA: Diagnosis not present

## 2023-01-31 DIAGNOSIS — E1122 Type 2 diabetes mellitus with diabetic chronic kidney disease: Secondary | ICD-10-CM | POA: Diagnosis not present

## 2023-01-31 DIAGNOSIS — N2581 Secondary hyperparathyroidism of renal origin: Secondary | ICD-10-CM | POA: Diagnosis not present

## 2023-01-31 DIAGNOSIS — I129 Hypertensive chronic kidney disease with stage 1 through stage 4 chronic kidney disease, or unspecified chronic kidney disease: Secondary | ICD-10-CM | POA: Diagnosis not present

## 2023-01-31 DIAGNOSIS — D631 Anemia in chronic kidney disease: Secondary | ICD-10-CM | POA: Diagnosis not present

## 2023-02-01 DIAGNOSIS — R413 Other amnesia: Secondary | ICD-10-CM | POA: Diagnosis not present

## 2023-02-01 DIAGNOSIS — I519 Heart disease, unspecified: Secondary | ICD-10-CM | POA: Diagnosis not present

## 2023-02-01 DIAGNOSIS — E7800 Pure hypercholesterolemia, unspecified: Secondary | ICD-10-CM | POA: Diagnosis not present

## 2023-02-01 DIAGNOSIS — N183 Chronic kidney disease, stage 3 unspecified: Secondary | ICD-10-CM | POA: Diagnosis not present

## 2023-02-01 DIAGNOSIS — E1122 Type 2 diabetes mellitus with diabetic chronic kidney disease: Secondary | ICD-10-CM | POA: Diagnosis not present

## 2023-02-01 DIAGNOSIS — I119 Hypertensive heart disease without heart failure: Secondary | ICD-10-CM | POA: Diagnosis not present

## 2023-02-01 DIAGNOSIS — G720 Drug-induced myopathy: Secondary | ICD-10-CM | POA: Diagnosis not present

## 2023-02-01 DIAGNOSIS — M1990 Unspecified osteoarthritis, unspecified site: Secondary | ICD-10-CM | POA: Diagnosis not present

## 2023-02-01 DIAGNOSIS — Z0000 Encounter for general adult medical examination without abnormal findings: Secondary | ICD-10-CM | POA: Diagnosis not present

## 2023-02-01 DIAGNOSIS — Z9181 History of falling: Secondary | ICD-10-CM | POA: Diagnosis not present

## 2023-03-18 DIAGNOSIS — M5387 Other specified dorsopathies, lumbosacral region: Secondary | ICD-10-CM | POA: Diagnosis not present

## 2023-03-18 DIAGNOSIS — M9902 Segmental and somatic dysfunction of thoracic region: Secondary | ICD-10-CM | POA: Diagnosis not present

## 2023-03-18 DIAGNOSIS — M47816 Spondylosis without myelopathy or radiculopathy, lumbar region: Secondary | ICD-10-CM | POA: Diagnosis not present

## 2023-03-18 DIAGNOSIS — M9903 Segmental and somatic dysfunction of lumbar region: Secondary | ICD-10-CM | POA: Diagnosis not present

## 2023-03-18 DIAGNOSIS — M461 Sacroiliitis, not elsewhere classified: Secondary | ICD-10-CM | POA: Diagnosis not present

## 2023-03-18 DIAGNOSIS — M9905 Segmental and somatic dysfunction of pelvic region: Secondary | ICD-10-CM | POA: Diagnosis not present

## 2023-03-18 DIAGNOSIS — M7918 Myalgia, other site: Secondary | ICD-10-CM | POA: Diagnosis not present

## 2023-03-26 DIAGNOSIS — H90A31 Mixed conductive and sensorineural hearing loss, unilateral, right ear with restricted hearing on the contralateral side: Secondary | ICD-10-CM | POA: Diagnosis not present

## 2023-03-26 DIAGNOSIS — H9311 Tinnitus, right ear: Secondary | ICD-10-CM | POA: Diagnosis not present

## 2023-04-15 DIAGNOSIS — M7918 Myalgia, other site: Secondary | ICD-10-CM | POA: Diagnosis not present

## 2023-04-15 DIAGNOSIS — M5387 Other specified dorsopathies, lumbosacral region: Secondary | ICD-10-CM | POA: Diagnosis not present

## 2023-04-15 DIAGNOSIS — M461 Sacroiliitis, not elsewhere classified: Secondary | ICD-10-CM | POA: Diagnosis not present

## 2023-04-15 DIAGNOSIS — M9902 Segmental and somatic dysfunction of thoracic region: Secondary | ICD-10-CM | POA: Diagnosis not present

## 2023-04-15 DIAGNOSIS — M47816 Spondylosis without myelopathy or radiculopathy, lumbar region: Secondary | ICD-10-CM | POA: Diagnosis not present

## 2023-04-15 DIAGNOSIS — M9903 Segmental and somatic dysfunction of lumbar region: Secondary | ICD-10-CM | POA: Diagnosis not present

## 2023-04-15 DIAGNOSIS — M9905 Segmental and somatic dysfunction of pelvic region: Secondary | ICD-10-CM | POA: Diagnosis not present

## 2023-04-16 DIAGNOSIS — M899 Disorder of bone, unspecified: Secondary | ICD-10-CM | POA: Diagnosis not present

## 2023-04-16 DIAGNOSIS — G319 Degenerative disease of nervous system, unspecified: Secondary | ICD-10-CM | POA: Diagnosis not present

## 2023-04-16 DIAGNOSIS — H90A31 Mixed conductive and sensorineural hearing loss, unilateral, right ear with restricted hearing on the contralateral side: Secondary | ICD-10-CM | POA: Diagnosis not present

## 2023-05-13 DIAGNOSIS — M9902 Segmental and somatic dysfunction of thoracic region: Secondary | ICD-10-CM | POA: Diagnosis not present

## 2023-05-13 DIAGNOSIS — M461 Sacroiliitis, not elsewhere classified: Secondary | ICD-10-CM | POA: Diagnosis not present

## 2023-05-13 DIAGNOSIS — M5387 Other specified dorsopathies, lumbosacral region: Secondary | ICD-10-CM | POA: Diagnosis not present

## 2023-05-13 DIAGNOSIS — M47816 Spondylosis without myelopathy or radiculopathy, lumbar region: Secondary | ICD-10-CM | POA: Diagnosis not present

## 2023-05-13 DIAGNOSIS — M7918 Myalgia, other site: Secondary | ICD-10-CM | POA: Diagnosis not present

## 2023-05-13 DIAGNOSIS — M9905 Segmental and somatic dysfunction of pelvic region: Secondary | ICD-10-CM | POA: Diagnosis not present

## 2023-05-13 DIAGNOSIS — M9903 Segmental and somatic dysfunction of lumbar region: Secondary | ICD-10-CM | POA: Diagnosis not present

## 2023-06-18 DIAGNOSIS — M9903 Segmental and somatic dysfunction of lumbar region: Secondary | ICD-10-CM | POA: Diagnosis not present

## 2023-06-18 DIAGNOSIS — M47816 Spondylosis without myelopathy or radiculopathy, lumbar region: Secondary | ICD-10-CM | POA: Diagnosis not present

## 2023-06-18 DIAGNOSIS — M9902 Segmental and somatic dysfunction of thoracic region: Secondary | ICD-10-CM | POA: Diagnosis not present

## 2023-06-18 DIAGNOSIS — M5387 Other specified dorsopathies, lumbosacral region: Secondary | ICD-10-CM | POA: Diagnosis not present

## 2023-06-18 DIAGNOSIS — M7918 Myalgia, other site: Secondary | ICD-10-CM | POA: Diagnosis not present

## 2023-06-18 DIAGNOSIS — M9905 Segmental and somatic dysfunction of pelvic region: Secondary | ICD-10-CM | POA: Diagnosis not present

## 2023-06-18 DIAGNOSIS — M461 Sacroiliitis, not elsewhere classified: Secondary | ICD-10-CM | POA: Diagnosis not present

## 2023-06-27 DIAGNOSIS — D3131 Benign neoplasm of right choroid: Secondary | ICD-10-CM | POA: Diagnosis not present

## 2023-07-09 DIAGNOSIS — Z23 Encounter for immunization: Secondary | ICD-10-CM | POA: Diagnosis not present

## 2023-07-16 DIAGNOSIS — M461 Sacroiliitis, not elsewhere classified: Secondary | ICD-10-CM | POA: Diagnosis not present

## 2023-07-16 DIAGNOSIS — M9905 Segmental and somatic dysfunction of pelvic region: Secondary | ICD-10-CM | POA: Diagnosis not present

## 2023-07-16 DIAGNOSIS — M9902 Segmental and somatic dysfunction of thoracic region: Secondary | ICD-10-CM | POA: Diagnosis not present

## 2023-07-16 DIAGNOSIS — M5387 Other specified dorsopathies, lumbosacral region: Secondary | ICD-10-CM | POA: Diagnosis not present

## 2023-07-16 DIAGNOSIS — M47816 Spondylosis without myelopathy or radiculopathy, lumbar region: Secondary | ICD-10-CM | POA: Diagnosis not present

## 2023-07-16 DIAGNOSIS — M7918 Myalgia, other site: Secondary | ICD-10-CM | POA: Diagnosis not present

## 2023-07-16 DIAGNOSIS — M9903 Segmental and somatic dysfunction of lumbar region: Secondary | ICD-10-CM | POA: Diagnosis not present

## 2023-07-31 DIAGNOSIS — N1831 Chronic kidney disease, stage 3a: Secondary | ICD-10-CM | POA: Diagnosis not present

## 2023-07-31 DIAGNOSIS — I129 Hypertensive chronic kidney disease with stage 1 through stage 4 chronic kidney disease, or unspecified chronic kidney disease: Secondary | ICD-10-CM | POA: Diagnosis not present

## 2023-07-31 DIAGNOSIS — D631 Anemia in chronic kidney disease: Secondary | ICD-10-CM | POA: Diagnosis not present

## 2023-07-31 DIAGNOSIS — E1122 Type 2 diabetes mellitus with diabetic chronic kidney disease: Secondary | ICD-10-CM | POA: Diagnosis not present

## 2023-08-05 DIAGNOSIS — E1122 Type 2 diabetes mellitus with diabetic chronic kidney disease: Secondary | ICD-10-CM | POA: Diagnosis not present

## 2023-08-05 DIAGNOSIS — R413 Other amnesia: Secondary | ICD-10-CM | POA: Diagnosis not present

## 2023-08-05 DIAGNOSIS — G720 Drug-induced myopathy: Secondary | ICD-10-CM | POA: Diagnosis not present

## 2023-08-05 DIAGNOSIS — I509 Heart failure, unspecified: Secondary | ICD-10-CM | POA: Diagnosis not present

## 2023-08-05 DIAGNOSIS — N183 Chronic kidney disease, stage 3 unspecified: Secondary | ICD-10-CM | POA: Diagnosis not present

## 2023-08-05 DIAGNOSIS — E7800 Pure hypercholesterolemia, unspecified: Secondary | ICD-10-CM | POA: Diagnosis not present

## 2023-08-05 DIAGNOSIS — I130 Hypertensive heart and chronic kidney disease with heart failure and stage 1 through stage 4 chronic kidney disease, or unspecified chronic kidney disease: Secondary | ICD-10-CM | POA: Diagnosis not present

## 2023-09-21 DIAGNOSIS — H00013 Hordeolum externum right eye, unspecified eyelid: Secondary | ICD-10-CM | POA: Diagnosis not present

## 2023-10-28 DIAGNOSIS — D224 Melanocytic nevi of scalp and neck: Secondary | ICD-10-CM | POA: Diagnosis not present

## 2023-10-28 DIAGNOSIS — D2261 Melanocytic nevi of right upper limb, including shoulder: Secondary | ICD-10-CM | POA: Diagnosis not present

## 2023-10-28 DIAGNOSIS — L821 Other seborrheic keratosis: Secondary | ICD-10-CM | POA: Diagnosis not present

## 2023-10-28 DIAGNOSIS — D225 Melanocytic nevi of trunk: Secondary | ICD-10-CM | POA: Diagnosis not present

## 2023-12-20 DIAGNOSIS — L299 Pruritus, unspecified: Secondary | ICD-10-CM | POA: Diagnosis not present

## 2023-12-20 DIAGNOSIS — A932 Colorado tick fever: Secondary | ICD-10-CM | POA: Diagnosis not present

## 2023-12-20 DIAGNOSIS — S40861A Insect bite (nonvenomous) of right upper arm, initial encounter: Secondary | ICD-10-CM | POA: Diagnosis not present

## 2024-01-14 DIAGNOSIS — E1122 Type 2 diabetes mellitus with diabetic chronic kidney disease: Secondary | ICD-10-CM | POA: Diagnosis not present

## 2024-01-14 DIAGNOSIS — N189 Chronic kidney disease, unspecified: Secondary | ICD-10-CM | POA: Diagnosis not present

## 2024-01-14 DIAGNOSIS — D631 Anemia in chronic kidney disease: Secondary | ICD-10-CM | POA: Diagnosis not present

## 2024-01-14 DIAGNOSIS — N2581 Secondary hyperparathyroidism of renal origin: Secondary | ICD-10-CM | POA: Diagnosis not present

## 2024-01-14 DIAGNOSIS — I129 Hypertensive chronic kidney disease with stage 1 through stage 4 chronic kidney disease, or unspecified chronic kidney disease: Secondary | ICD-10-CM | POA: Diagnosis not present

## 2024-01-14 DIAGNOSIS — N1831 Chronic kidney disease, stage 3a: Secondary | ICD-10-CM | POA: Diagnosis not present

## 2024-01-14 DIAGNOSIS — E559 Vitamin D deficiency, unspecified: Secondary | ICD-10-CM | POA: Diagnosis not present

## 2024-02-06 DIAGNOSIS — I519 Heart disease, unspecified: Secondary | ICD-10-CM | POA: Diagnosis not present

## 2024-02-06 DIAGNOSIS — I110 Hypertensive heart disease with heart failure: Secondary | ICD-10-CM | POA: Diagnosis not present

## 2024-02-06 DIAGNOSIS — Z0000 Encounter for general adult medical examination without abnormal findings: Secondary | ICD-10-CM | POA: Diagnosis not present

## 2024-02-06 DIAGNOSIS — E7800 Pure hypercholesterolemia, unspecified: Secondary | ICD-10-CM | POA: Diagnosis not present

## 2024-02-06 DIAGNOSIS — G720 Drug-induced myopathy: Secondary | ICD-10-CM | POA: Diagnosis not present

## 2024-02-06 DIAGNOSIS — E1122 Type 2 diabetes mellitus with diabetic chronic kidney disease: Secondary | ICD-10-CM | POA: Diagnosis not present

## 2024-02-06 DIAGNOSIS — R413 Other amnesia: Secondary | ICD-10-CM | POA: Diagnosis not present

## 2024-02-06 DIAGNOSIS — N183 Chronic kidney disease, stage 3 unspecified: Secondary | ICD-10-CM | POA: Diagnosis not present

## 2024-02-06 DIAGNOSIS — M1990 Unspecified osteoarthritis, unspecified site: Secondary | ICD-10-CM | POA: Diagnosis not present

## 2024-03-05 DIAGNOSIS — E119 Type 2 diabetes mellitus without complications: Secondary | ICD-10-CM | POA: Diagnosis not present

## 2024-03-07 DIAGNOSIS — I130 Hypertensive heart and chronic kidney disease with heart failure and stage 1 through stage 4 chronic kidney disease, or unspecified chronic kidney disease: Secondary | ICD-10-CM | POA: Diagnosis not present

## 2024-03-07 DIAGNOSIS — E7800 Pure hypercholesterolemia, unspecified: Secondary | ICD-10-CM | POA: Diagnosis not present

## 2024-03-07 DIAGNOSIS — E1122 Type 2 diabetes mellitus with diabetic chronic kidney disease: Secondary | ICD-10-CM | POA: Diagnosis not present

## 2024-03-07 DIAGNOSIS — M1990 Unspecified osteoarthritis, unspecified site: Secondary | ICD-10-CM | POA: Diagnosis not present
